# Patient Record
Sex: Male | Born: 1995 | Race: Black or African American | Hispanic: No | Marital: Single | Smoking: Never smoker
Health system: Southern US, Community
[De-identification: ages and names within clinical notes are randomized; demographics above are authoritative.]

---

## 2014-09-07 ENCOUNTER — Ambulatory Visit (INDEPENDENT_AMBULATORY_CARE_PROVIDER_SITE_OTHER): Payer: PRIVATE HEALTH INSURANCE | Admitting: Family Medicine

## 2014-09-07 VITALS — BP 108/66 | HR 68 | Temp 98.5°F | Resp 18 | Ht 77.0 in | Wt 213.0 lb

## 2014-09-07 DIAGNOSIS — K625 Hemorrhage of anus and rectum: Secondary | ICD-10-CM | POA: Diagnosis not present

## 2014-09-07 LAB — POC HEMOCCULT BLD/STL (OFFICE/1-CARD/DIAGNOSTIC): Fecal Occult Blood, POC: POSITIVE

## 2014-09-07 LAB — POCT CBC
Granulocyte percent: 54.2 %G (ref 37–80)
HCT, POC: 46.1 % (ref 43.5–53.7)
Hemoglobin: 14.6 g/dL (ref 14.1–18.1)
LYMPH, POC: 2.7 (ref 0.6–3.4)
MCH: 30.8 pg (ref 27–31.2)
MCHC: 31.8 g/dL (ref 31.8–35.4)
MCV: 97 fL (ref 80–97)
MID (CBC): 0.4 (ref 0–0.9)
MPV: 9 fL (ref 0–99.8)
PLATELET COUNT, POC: 156 10*3/uL (ref 142–424)
POC Granulocyte: 3.7 (ref 2–6.9)
POC LYMPH %: 40.2 % (ref 10–50)
POC MID %: 5.6 % (ref 0–12)
RBC: 4.75 M/uL (ref 4.69–6.13)
RDW, POC: 13.9 %
WBC: 6.8 10*3/uL (ref 4.6–10.2)

## 2014-09-07 NOTE — Progress Notes (Signed)
  Scott Orr - 18 y.o. male MRN [id number]  Date of birth: 10/30/1996  SUBJECTIVE:  Including CC & ROS.  patient C/O: rectal bleeding for 1 week Onset of symptoms: 1 week Symptoms: bright red and dark blood intermixed in stool, no straining, no rectal pain, no hemorrhoids, no diarrhea, no abdominal pain, no nausea, no vomiting, no malaise, no recent constipation. No family history of chron's, colitis, or colon cancer is know by the patient. He has been eating and drinking normally Relieving factors: nothing Worsened by: nothing he can think of    ROS:  Constitutional:  No fever, chills, or fatigue.  Respiratory:  No shortness of breath, cough, or wheezing Cardiovascular:  No palpitations, chest pain or syncope Gastrointestinal:  No nausea, no abdominal pain Review of systems otherwise negative except for what is stated in HPI  HISTORY: Past Medical, Surgical, Social, and Family History Reviewed & Updated per EMR. Pertinent Historical Findings include: Visual merchandiseronsmoker Student playing basketball at [employer]  PHYSICAL EXAM:  VS: BP:108/66 mmHg  HR:68bpm  TEMP:98.5 F (36.9 C)(Oral)  RESP:99 %  HT:6\' 5"  (195.6 cm)   WT:213 lb (96.616 kg)  BMI:25.3 PHYSICAL EXAM: General:  Alert and oriented, No acute distress.   HENT:  Normocephalic, Oral mucosa is moist.   Respiratory:  Lungs are clear to auscultation, Respirations are non-labored, Symmetrical chest wall expansion.   Cardiovascular:  Normal rate, Regular rhythm, No murmur, Good pulses equal in all extremities, No edema.   Gastrointestinal:  Soft, Non-tender, Non-distended, Normal bowel sounds, No organomegaly.   Rectal Exam:  Reveals no external hemorrhoids, no internal hemorrhoids on exam.  Hemoccult Stool sample obtained:  Integumentary:  Warm, Dry, No rash.   Neurologic:  Alert, Oriented, No focal defects Psychiatric:  Cooperative, Appropriate mood & affect.    ASSESSMENT & PLAN:  Rectal bleeding without acute blood  loss anemia DDx: internal hemorrhoids, chron's, colitis, Diverticulosis  Recommendations: - CBC with a stable H/H at  - Hemoccult sample positive for blood - Referral to GI for evaluation and possible colonoscopy

## 2014-09-08 NOTE — Addendum Note (Signed)
Addended by: Ethelda ChickSMITH, Jhordan Mckibben M on: 09/08/2014 09:30 AM   Modules accepted: Level of Service

## 2014-09-08 NOTE — Progress Notes (Signed)
History and physical reviewed in detail with Dr. Tammy Soursidiano. Agree with assessment and plan.

## 2014-09-29 ENCOUNTER — Encounter: Payer: Self-pay | Admitting: Sports Medicine

## 2014-09-29 ENCOUNTER — Ambulatory Visit (INDEPENDENT_AMBULATORY_CARE_PROVIDER_SITE_OTHER): Payer: Medicaid Other | Admitting: Sports Medicine

## 2014-09-29 VITALS — Ht 77.0 in | Wt 215.0 lb

## 2014-09-29 DIAGNOSIS — M25571 Pain in right ankle and joints of right foot: Secondary | ICD-10-CM

## 2014-09-29 MED ORDER — MELOXICAM 15 MG PO TABS: 15.0000 mg | ORAL_TABLET | Freq: Every day | ORAL | Status: AC

## 2014-09-29 NOTE — Progress Notes (Addendum)
Subjective:    Patient ID: Scott Orr, male    DOB: December 19, 1995, 18 y.o.   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 He suffered 3 inversion injuries to his ankle during the game. After the third injury he removed himself from the contest. He's complaining of pain at the base of the fifth metatarsal. He was seen at [facility] earlier this week and x-rays of his right foot were obtained. They are available for review. He was told that he might have a "small crack". He was placed into a Cam Walker which he finds quite comfortable. He has not noticed any swelling. He denies pain elsewhere in his foot. No pain in his ankle. He has a history of recurring ankle sprains and a "ankle fracture". He does have a med spec brace to wear with basketball but admits that he was not wearing it during the game. He denies any pain more proximally in his knee. Past medical history reviewed Surgical history reviewed No known drug allergies Social history reviewed    Review of Systems     Objective:   Physical Exam Well-developed, well-nourished. No acute distress. Awake alert and oriented 3. Vital signs reviewed  Right foot: There is tenderness to palpation at the styloid of the base of the fifth metatarsal. There is no soft tissue swelling. There is no tenderness to palpation more proximally along the peroneal tendon. Pain is reproducible with dorsiflexion and eversion of the foot. There is no tenderness to palpation along the fifth metatarsal shaft nor over the area of a Jones fracture. No pain with metatarsal squeeze today. Right ankle: No swelling. Full range of motion. Negative anterior drawer. 1+ talar tilt. No tenderness over the distal fibular medial malleolus. Neurovascularly intact distally. Walking with a slight limp.  MSK ultrasound of the right  foot was performed. Limited images of the right fifth metatarsal were obtained. There is no cortical irregularity to suggest fracture. There is no increased neovascularity in the cortex of the bone. There is a slight amount of edema at the insertion of the peroneal brevis tendon at the base of the fifth metatarsal but no obvious tear of the tendon is seen. Tendon is normal more proximally. Findings are consistent with peroneal brevis tendon strain at the insertion onto the fifth metatarsal.  X-rays from [facility] dated 09/27/2014 are reviewed. Images include AP, lateral, and oblique views. No obvious fracture is seen.       Assessment & Plan:  Right foot pain secondary to peroneal tendon strain  Patient will remain in his Cam Walker for an additional week. I'll place him on meloxicam 15 mg daily for the next 7 days. We will follow his progress in the training room at [employer]. Once he is out of his Cam Dan HumphreysWalker we will start rehabilitation in the training room and will increase activity as tolerated. If symptoms persist I may need to consider merits of further diagnostic imaging in the form of an MRI of his foot but his x-rays, clinical exam, and ultrasound findings today do not seem to suggest any sort of stress fracture or Jones fracture.    Addendum: 10/09/14  Patient seen by Dr. Tammy Soursidiano in the training room for f/u on his peroneal tendon strain. Patient was in a cam walking boot for 1 week. His swelling has resolved and he reports improvement  in pain. Patient did participate in a non-contact practice on Sunday and had pain with running. Patient has not done any rehab type strengthening or cutting or pivoting drills. No TTP at peroneal tendon attachments, normal ankle ROM, no ligament laxity. Based on this improvement but still having some pain we recommended continue practice with gradual return to contact over the next 2-3 days. If pain continues to with running and cutting  will recommend MRI. Patient will not pain Wednesday and will plan to play Saturday as long as he continues to clinical improve throughout the week.

## 2014-10-10 ENCOUNTER — Encounter: Payer: Self-pay | Admitting: Sports Medicine

## 2016-07-11 ENCOUNTER — Ambulatory Visit (HOSPITAL_COMMUNITY)
Admission: EM | Admit: 2016-07-11 | Discharge: 2016-07-11 | Disposition: A | Discharge status: Home / Self Care | Payer: Medicaid Other | Attending: Family Medicine | Admitting: Family Medicine

## 2016-07-11 ENCOUNTER — Encounter (HOSPITAL_COMMUNITY): Payer: Self-pay | Admitting: *Deleted

## 2016-07-11 DIAGNOSIS — Z711 Person with feared health complaint in whom no diagnosis is made: Secondary | ICD-10-CM

## 2016-07-11 DIAGNOSIS — Z202 Contact with and (suspected) exposure to infections with a predominantly sexual mode of transmission: Secondary | ICD-10-CM

## 2016-07-11 MED ORDER — CEFTRIAXONE SODIUM 250 MG IJ SOLR
250.0000 mg | Freq: Once | INTRAMUSCULAR | Status: AC
  Administered 2016-07-11: 250 mg via INTRAMUSCULAR

## 2016-07-11 MED ORDER — AZITHROMYCIN 250 MG PO TABS
ORAL_TABLET | ORAL | Status: AC
  Filled 2016-07-11: qty 4

## 2016-07-11 MED ORDER — CEFTRIAXONE SODIUM 250 MG IJ SOLR
INTRAMUSCULAR | Status: AC
  Filled 2016-07-11: qty 250

## 2016-07-11 MED ORDER — AZITHROMYCIN 250 MG PO TABS
1000.0000 mg | ORAL_TABLET | Freq: Once | ORAL | Status: AC
  Administered 2016-07-11: 1000 mg via ORAL

## 2016-07-11 NOTE — Discharge Instructions (Signed)
We will call with test results and treat as indicated °

## 2016-07-11 NOTE — ED Triage Notes (Signed)
Pt  denys    Any  Symptoms   However        Was  Told      By  His  Partner   That  They    Had  chylymydia     today

## 2016-07-11 NOTE — ED Provider Notes (Signed)
[facility]    CSN: [id number] Arrival date & time: 07/11/16  1849  First Provider Contact:  None       History   Chief Complaint Chief Complaint  Patient presents with  . Exposure to STD    HPI Scott Orr is a 20 y.o. male.    Male GU Problem  Presenting symptoms: no dysuria and no penile discharge   Presenting symptoms comment:  Report that a sexual contact called today and said she had chlamydia, pt with no sx, did not use condoms with this person. Context: after intercourse   Relieved by:  None tried Worsened by:  Nothing Ineffective treatments:  None tried Associated symptoms: no abdominal pain, no genital rash, no groin pain, no penile swelling, no urinary frequency, no urinary hesitation, no urinary incontinence and no urinary retention     History reviewed. No pertinent past medical history.  There are no active problems to display for this patient.   History reviewed. No pertinent surgical history.     Home Medications    Prior to Admission medications   Medication Sig Start Date End Date Taking? Authorizing Provider  meloxicam (MOBIC) 15 MG tablet Take 1 tablet (15 mg total) by mouth daily. 09/29/14   Ralene Corkimothy R Draper, DO    Family History History reviewed. No pertinent family history.  Social History Social History  Substance Use Topics  . Smoking status: Never Smoker  . Smokeless tobacco: Not on file  . Alcohol use Yes     Allergies   Review of patient's allergies indicates no known allergies.   Review of Systems Review of Systems  Gastrointestinal: Negative for abdominal pain.  Genitourinary: Negative for bladder incontinence, discharge, dysuria, frequency, hesitancy, penile swelling and urgency.  All other systems reviewed and are negative.    Physical Exam Triage Vital Signs ED Triage Vitals [07/11/16 1912]  Enc Vitals Group     BP 128/72     Pulse Rate 60     Resp 16     Temp 98.6 F (37 C)     Temp  Source Oral     SpO2 99 %     Weight      Height      Head Circumference      Peak Flow      Pain Score      Pain Loc      Pain Edu?      Excl. in GC?    No data found.   Updated Vital Signs BP 128/72 (BP Location: Left Arm)   Pulse 60   Temp 98.6 F (37 C) (Oral)   Resp 16   SpO2 99%   Visual Acuity Right Eye Distance:   Left Eye Distance:   Bilateral Distance:    Right Eye Near:   Left Eye Near:    Bilateral Near:     Physical Exam  Constitutional: He is oriented to person, place, and time. He appears well-developed and well-nourished.  Abdominal: Soft. Bowel sounds are normal.  Genitourinary: Penis normal.  Neurological: He is alert and oriented to person, place, and time. He has normal reflexes.  Skin: Skin is warm and dry.  Nursing note and vitals reviewed.    UC Treatments / Results  Labs (all labs ordered are listed, but only abnormal results are displayed) Labs Reviewed - No data to display  EKG  EKG Interpretation None       Radiology No results found.  Procedures Procedures (including  critical care time)  Medications Ordered in UC Medications - No data to display   Initial Impression / Assessment and Plan / UC Course  I have reviewed the triage vital signs and the nursing notes.  Pertinent labs & imaging results that were available during my care of the patient were reviewed by me and considered in my medical decision making (see chart for details).  Clinical Course      Final Clinical Impressions(s) / UC Diagnoses   Final diagnoses:  None    New Prescriptions New Prescriptions   No medications on file     Linna Hoff, MD 07/11/16 1949

## 2016-07-15 LAB — CYTOLOGY, (ORAL, ANAL, URETHRAL) ANCILLARY ONLY
Chlamydia: NEGATIVE
Neisseria Gonorrhea: NEGATIVE
Trichomonas: NEGATIVE

## 2016-09-17 ENCOUNTER — Ambulatory Visit (INDEPENDENT_AMBULATORY_CARE_PROVIDER_SITE_OTHER): Payer: Medicaid Other | Admitting: Student

## 2016-09-17 ENCOUNTER — Encounter: Payer: Self-pay | Admitting: Student

## 2016-09-17 DIAGNOSIS — M216X1 Other acquired deformities of right foot: Secondary | ICD-10-CM

## 2016-09-17 DIAGNOSIS — M216X9 Other acquired deformities of unspecified foot: Secondary | ICD-10-CM | POA: Insufficient documentation

## 2016-09-17 DIAGNOSIS — M216X2 Other acquired deformities of left foot: Secondary | ICD-10-CM | POA: Diagnosis not present

## 2016-09-17 DIAGNOSIS — M25579 Pain in unspecified ankle and joints of unspecified foot: Secondary | ICD-10-CM | POA: Diagnosis not present

## 2016-09-17 NOTE — Assessment & Plan Note (Addendum)
Overpronation of his bilateral feet were found with running. He was given arch straps to help with arch pain. He is also given green insoles to wear shoes. He will follow-up in 1 week for custom orthotics. He is instructed to bring his basketball shoes.

## 2016-09-17 NOTE — Progress Notes (Signed)
  Guadelupe Sabinlston Rovner - 20 y.o. male MRN [id number]  Date of birth: 1996-01-06  SUBJECTIVE:  Including CC & ROS.  CC: Left foot pain Presents for left greater than right foot pain. 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 He is having foot pain when he runs in his midfoot. He had been given green insoles previously which have helped with his pain. He lost his insoles and would like new ones.  He has pain when he runs. Denies any swelling to the area and no pain at rest. Denies any numbness or tingling distally.   ROS: No unexpected weight loss, fever, chills, swelling, instability, muscle pain, numbness/tingling, redness, otherwise see HPI   PMHx - Updated and reviewed.  Contributory factors include: Negative PSHx - Updated and reviewed.  Contributory factors include:  Negative FHx - Updated and reviewed.  Contributory factors include:  Negative Social Hx - Updated and reviewed. Contributory factors include: Negative, [employer] player. Medications - reviewed   DATA REVIEWED: Previous office visits  PHYSICAL EXAM:  VS: BP:(!) 123/45  HR: bpm  TEMP: ( )  RESP:   HT:6\' 5"  (195.6 cm)   WT:215 lb (97.5 kg)  BMI:25.5 PHYSICAL EXAM: Gen: NAD, alert, cooperative with exam, well-appearing HEENT: clear conjunctiva,  CV:  no edema, capillary refill brisk, normal rate Resp: non-labored Skin: no rashes, normal turgor  Neuro: no gross deficits.  Psych:  alert and oriented  Ankle & Foot: No visible swelling, ecchymosis, erythema, ulcers, calluses, blister Arch: Normal w/o pes cavus or planus  Achilles tendon without nodules or tenderness No swelling of retrocalcaneal bursa No pain at MT heads No pain at base of 5th MT; No tenderness over cuboid; No tenderness over N spot or navicular prominence No tenderness on lateral and medial malleolus No sign of peroneal tendon subluxations or tenderness to palpation Full in plantarflexion, inversion, and eversion of the foot; flexion and  extension of the toes, slight decrease of range of motion in dorsiflexion to 90  Strength: 5/5 in all directions. Sensation: intact Vascular: intact w/ dorsalis pedis & posterior tibialis pulses 2+  Gait  Overall balanced gait with appropriate base width and stride length  Foot: pronation when walking; No in-toeing or out-toeing; No foot slap or high steppage gait  Knee: extension and flexion adequate w/o genu varus or valgus  Hip: No circumduction or contralateral drop  Trunk: Neutral w/o lean      ASSESSMENT & PLAN:   Pronation of foot Overpronation of his bilateral feet were found with running. He was given arch straps to help with arch pain. He is also given green insoles to wear shoes. He will follow-up in 1 week for custom orthotics. He is instructed to bring his basketball shoes.

## 2016-09-24 ENCOUNTER — Encounter: Payer: Medicaid Other | Admitting: Student

## 2016-09-26 ENCOUNTER — Ambulatory Visit (INDEPENDENT_AMBULATORY_CARE_PROVIDER_SITE_OTHER): Payer: Medicaid Other | Admitting: Student

## 2016-09-26 ENCOUNTER — Encounter: Payer: Self-pay | Admitting: Student

## 2016-09-26 VITALS — BP 114/56 | HR 65 | Ht 77.0 in | Wt 215.0 lb

## 2016-09-26 DIAGNOSIS — M216X2 Other acquired deformities of left foot: Secondary | ICD-10-CM

## 2016-09-26 DIAGNOSIS — M216X1 Other acquired deformities of right foot: Secondary | ICD-10-CM | POA: Diagnosis present

## 2016-09-26 NOTE — Progress Notes (Signed)
  Scott Orr - 20 y.o. male MRN [id number]  Date of birth: 09-06-1996  SUBJECTIVE:  Including CC & ROS.  CC: right foot pain  Presents for follow up of right midfoot pain and overpronation of bilateral feet.  He presents for orthotics.  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  He has done well with green insoles, but feels he needs more support.   ROS: No unexpected weight loss, fever, chills, swelling, instability, muscle pain, numbness/tingling, redness, otherwise see HPI   PMHx - Updated and reviewed.  Contributory factors include: Negative PSHx - Updated and reviewed.  Contributory factors include:  Negative FHx - Updated and reviewed.  Contributory factors include:  Negative Social Hx - Updated and reviewed. Contributory factors include: Negative Medications - reviewed   DATA REVIEWED: none  PHYSICAL EXAM:  VS: BP:(!) 114/56  HR:65bpm  TEMP: ( )  RESP:   HT:6\' 5"  (195.6 cm)   WT:215 lb (97.5 kg)  BMI:25.5 PHYSICAL EXAM: Gen: NAD, alert, cooperative with exam, well-appearing HEENT: clear conjunctiva,  CV:  no edema, capillary refill brisk, normal rate Resp: non-labored Skin: no rashes, normal turgor  Neuro: no gross deficits.  Psych:  alert and oriented  Ankle & Foot: No visible swelling, ecchymosis, erythema, ulcers, calluses, blister Arch: Normal w/o pes cavus or planus  No pain at base of 5th MT; No tenderness over cuboid; No tenderness over N spot or navicular prominence No sign of peroneal tendon subluxations or tenderness to palpation Full in plantarflexion, dorsiflexion, inversion, and eversion of the foot; flexion and extension of the toes Strength: 5/5 in all directions. Sensation: intact Vascular: intact w/ dorsalis pedis & posterior tibialis pulses 2+  ASSESSMENT & PLAN:   Pronation of foot Custom orthotics provided.  He will start wearing in practice next week and follow up in training room.  Patient was fitted  for a : standard, cushioned, semi-rigid orthotic. The orthotic was heated and afterward the patient stood on the orthotic blank positioned on the orthotic stand. The patient was positioned in subtalar neutral position and 10 degrees of ankle dorsiflexion in a weight bearing stance. After completion of molding, a stable base was applied to the orthotic blank. The blank was ground to a stable position for weight bearing. Size: 16 Base: blue  Patient was counseled reviewing diagnosis and treatment in detail, totaling in 50 minutes, over half of which was spent in face to face counseling.

## 2016-09-26 NOTE — Assessment & Plan Note (Signed)
Custom orthotics provided.  He will start wearing in practice next week and follow up in training room.

## 2016-12-09 ENCOUNTER — Ambulatory Visit (INDEPENDENT_AMBULATORY_CARE_PROVIDER_SITE_OTHER): Payer: PRIVATE HEALTH INSURANCE | Admitting: Sports Medicine

## 2016-12-09 VITALS — BP 104/64 | Ht 77.0 in | Wt 210.0 lb

## 2016-12-09 DIAGNOSIS — J069 Acute upper respiratory infection, unspecified: Secondary | ICD-10-CM | POA: Diagnosis not present

## 2016-12-09 DIAGNOSIS — B9789 Other viral agents as the cause of diseases classified elsewhere: Secondary | ICD-10-CM

## 2016-12-09 NOTE — Progress Notes (Signed)
    Subjective:  Scott Orr is a 21 y.o. male who [facility] with a chief complaint of vomiting.   HPI:  Vomiting Patient's symptoms started about 3 days ago with cough, sneeze, sore throat and subjective fevers. He had a couple episodes of emesis after a coughing fit, though he did have another episode of emesis unrelated to coughing. Symptoms are much better today. He only has a mild cough. No fevers, nausea, or myalgias. He took dayquil, mucinex, and nyquil.   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    ROS: Per HPI   Objective:  Physical Exam: BP 104/64   Ht 6\' 5"  (1.956 m)   Wt 210 lb (95.3 kg)   BMI 24.90 kg/m   Gen: NAD, resting comfortably HEENT: TMs and OP clear.  CV: RRR with no murmurs appreciated Pulm: NWOB, Occasional expiratory wheeze.  MSK: no edema, cyanosis, or clubbing noted Skin: warm, dry Neuro: grossly normal, moves all extremities Psych: Normal affect and thought content  Assessment/Plan:  Viral URI Doubt flu given lack of fever. No signs of bacterial infection. Seems to be recovering normally. Given that he is afebrile, he is clear to go back to participate in basketball. Recommended use of albuterol prior to participation the next couple of days given his wheezing on exam. Return precautions reviewed. Follow up as needed.   Katina Degreealeb M. Jimmey RalphParker, MD [facility] Resident PGY-3 12/09/2016 11:21 AM   Patient seen and evaluated with the resident. I agree with the above plan of care. I think Scott Orr has a simple viral URI. He is feeling better. He has an albuterol inhaler to use as needed. He is cleared to return to basketball. Follow-up with me as needed.

## 2017-08-13 ENCOUNTER — Ambulatory Visit (INDEPENDENT_AMBULATORY_CARE_PROVIDER_SITE_OTHER): Payer: Self-pay | Admitting: Sports Medicine

## 2017-08-13 DIAGNOSIS — M722 Plantar fascial fibromatosis: Secondary | ICD-10-CM

## 2017-08-13 NOTE — Assessment & Plan Note (Signed)
Recommended that he restart wearing his insoles, was also given bilateral arch straps. Additionally was given exercises for heel raises unilaterally, toe stretches, toe walking, reverse walking, heel walking. Exercise and drill to pain tolerance over the next couple of weeks, slowly reintroduce formal basketball as he tolerates.

## 2017-08-13 NOTE — Progress Notes (Signed)
   Subjective:    Patient ID: Scott Orr, male    DOB: 1996/09/17, 21 y.o.   MRN: [id number]  HPI  Scott Orr presents today with ongoing bilateral plantar fasciitis pain left greater than right. He has previously been fitted with custom insoles and has also worn arch straps in the past. Has not been wearing either of these for several months because he is feeling better, but noticed an exacerbation of his foot pain a couple weeks ago while doing basketball sprints. Since that time, he has been using his orthotics most of the day, has been doing toe stretches, and has also been rolling a frozen water bottle under his foot. Notes that his foot pain is bearable after walking around for some time during the day.   Review of Systems  No swelling in feet No numbness     Objective:   Physical Exam BP 108/68   Ht  (1.956 m)   Wt 195 lb (88.5 kg)   BMI 23.12 kg/m   Bilateral feet: Minimal tenderness to palpation of  posterior insertion of plantar fascia. Mild pes planus bilat  No defect in ankle strength or range of motion bilaterally.  Normal gait.      Assessment & Plan:  Plantar fasciitis, bilateral Recommended that he restart wearing his insoles, was also given bilateral arch straps. Additionally was given exercises for heel raises unilaterally, toe stretches, toe walking, reverse walking, heel walking. Exercise and drill to pain tolerance over the next couple of weeks, slowly reintroduce formal basketball as he tolerates.   Loni Muse, MD   I observed and examined the patient with the resident and agree with assessment and plan.  Note reviewed and modified by me. Enid Baas, MD

## 2020-09-20 DIAGNOSIS — R0789 Other chest pain: Secondary | ICD-10-CM | POA: Insufficient documentation

## 2020-09-21 ENCOUNTER — Other Ambulatory Visit: Payer: Self-pay

## 2020-09-21 ENCOUNTER — Emergency Department (HOSPITAL_COMMUNITY): Payer: Self-pay

## 2020-09-21 ENCOUNTER — Encounter (HOSPITAL_COMMUNITY): Payer: Self-pay

## 2020-09-21 ENCOUNTER — Emergency Department (HOSPITAL_COMMUNITY)
Admission: EM | Admit: 2020-09-21 | Discharge: 2020-09-21 | Disposition: A | Discharge status: Home / Self Care | Payer: Self-pay | Attending: Emergency Medicine | Admitting: Emergency Medicine

## 2020-09-21 DIAGNOSIS — R0789 Other chest pain: Secondary | ICD-10-CM

## 2020-09-21 LAB — CBC
HCT: 44.8 % (ref 39.0–52.0)
Hemoglobin: 15.1 g/dL (ref 13.0–17.0)
MCH: 31.9 pg (ref 26.0–34.0)
MCHC: 33.7 g/dL (ref 30.0–36.0)
MCV: 94.5 fL (ref 80.0–100.0)
Platelets: 209 10*3/uL (ref 150–400)
RBC: 4.74 MIL/uL (ref 4.22–5.81)
RDW: 11.5 % (ref 11.5–15.5)
WBC: 10.3 10*3/uL (ref 4.0–10.5)
nRBC: 0 % (ref 0.0–0.2)

## 2020-09-21 LAB — TROPONIN I (HIGH SENSITIVITY)
Troponin I (High Sensitivity): <2 ng/L (ref <18)
Troponin I (High Sensitivity): <2 ng/L (ref <18)

## 2020-09-21 LAB — BASIC METABOLIC PANEL
Anion gap: 13 (ref 5–15)
BUN: 18 mg/dL (ref 6–20)
CO2: 24 mmol/L (ref 22–32)
Calcium: 9.4 mg/dL (ref 8.9–10.3)
Chloride: 102 mmol/L (ref 98–111)
Creatinine, Ser: 1.14 mg/dL (ref 0.61–1.24)
GFR, Estimated: >60 mL/min (ref >60)
Glucose, Bld: 136 mg/dL — ABNORMAL HIGH (ref 70–99)
Potassium: 3.6 mmol/L (ref 3.5–5.1)
Sodium: 139 mmol/L (ref 135–145)

## 2020-09-21 NOTE — Discharge Instructions (Addendum)
The ED work-up today was reassuring.  Your chest x-ray and laboratory tests were all normal.  Monitor for any recurrent symptoms including fevers and chills.  Follow-up with primary care doctor to schedule a routine checkup

## 2020-09-21 NOTE — ED Notes (Signed)
Pt refused to put on grown, also refused to be put on the monitor

## 2020-09-21 NOTE — ED Provider Notes (Signed)
[facility]-EMERGENCY DEPT Provider Note   CSN: [id number] Arrival date & time: 09/20/20  2358     History Chief Complaint  Patient presents with  . Chest Pain    Scott Orr is a 24 y.o. male.  HPI   Patient presented to the ED last night for an episode of chest discomfort associated with diffuse body discomfort as well as a sensation of not being able to control his body movements.  Patient states he did smoke marijuana prior to the onset of the symptoms but really did not think that was related to it.  He started having discomfort in his chest.  He states it was not really painful.  He started to feel like he was trembling and shaking and could not control his body.  Patient felt like he was having seizures but is never had seizures and was not losing consciousness.  Patient had to wait in the ED waiting room for evaluation before being seen and during that period of time all his symptoms have now resolved.  Patient denies having any trouble with fevers or chills.  No leg swelling.  Did not have any headache.  Patient has been vaccinated for Covid.  History reviewed. No pertinent past medical history.  Patient Active Problem List   Diagnosis Date Noted  . Plantar fasciitis, bilateral 08/13/2017  . Pronation of foot 09/17/2016    History reviewed. No pertinent surgical history.     No family history on file.  Social History   Tobacco Use  . Smoking status: Never Smoker  . Smokeless tobacco: Never Used  Substance Use Topics  . Alcohol use: Yes  . Drug use: No    Home Medications Prior to Admission medications   Medication Sig Start Date End Date Taking? Authorizing Provider  meloxicam (MOBIC) 15 MG tablet Take 1 tablet (15 mg total) by mouth daily. Patient not taking: Reported on 08/13/2017 09/29/14   Ralene Cork, DO    Allergies    Patient has no known allergies.  Review of Systems   Review of Systems  All other systems reviewed  and are negative.   Physical Exam Updated Vital Signs BP (!) 119/57 (BP Location: Left Arm)   Pulse 62   Temp 98.4 F (36.9 C) (Oral)   Resp 17   Ht 1.956 m (6\' 5" )   Wt 90.7 kg   SpO2 100%   BMI 23.72 kg/m   Physical Exam Vitals and nursing note reviewed.  Constitutional:      General: He is not in acute distress.    Appearance: He is well-developed.  HENT:     Head: Normocephalic and atraumatic.     Right Ear: External ear normal.     Left Ear: External ear normal.  Eyes:     General: No scleral icterus.       Right eye: No discharge.        Left eye: No discharge.     Conjunctiva/sclera: Conjunctivae normal.  Neck:     Trachea: No tracheal deviation.  Cardiovascular:     Rate and Rhythm: Normal rate and regular rhythm.  Pulmonary:     Effort: Pulmonary effort is normal. No respiratory distress.     Breath sounds: Normal breath sounds. No stridor. No wheezing or rales.  Abdominal:     General: Bowel sounds are normal. There is no distension.     Palpations: Abdomen is soft.     Tenderness: There is no abdominal tenderness.  There is no guarding or rebound.  Musculoskeletal:        General: No tenderness.     Cervical back: Neck supple.  Skin:    General: Skin is warm and dry.     Findings: No rash.  Neurological:     Mental Status: He is alert.     Cranial Nerves: No cranial nerve deficit (no facial droop, extraocular movements intact, no slurred speech).     Sensory: No sensory deficit.     Motor: No abnormal muscle tone or seizure activity.     Coordination: Coordination normal.     ED Results / Procedures / Treatments   Labs (all labs ordered are listed, but only abnormal results are displayed) Labs Reviewed  BASIC METABOLIC PANEL - Abnormal; Notable for the following components:      Result Value   Glucose, Bld 136 (*)    All other components within normal limits  CBC  TROPONIN I (HIGH SENSITIVITY)  TROPONIN I (HIGH SENSITIVITY)    EKG EKG  Interpretation  Date/Time:  Friday September 21 2020 00:16:43 EST Ventricular Rate:  118 PR Interval:    QRS Duration: 119 QT Interval:  332 QTC Calculation: 466 R Axis:   110 Text Interpretation: Sinus tachycardia Right atrial enlargement RBBB and LPFB Artifact in lead(s) I II aVR No old tracing to compare Confirmed by Linwood Dibbles [phone]) on 09/21/2020 7:01:45 AM   Radiology DG Chest 2 View  Result Date: 09/21/2020 CLINICAL DATA:  Chest pain, dyspnea EXAM: CHEST - 2 VIEW COMPARISON:  None. FINDINGS: The heart size and mediastinal contours are within normal limits. Both lungs are clear. The visualized skeletal structures are unremarkable. IMPRESSION: No active cardiopulmonary disease. Electronically Signed   By: Helyn Numbers MD   On: 09/21/2020 00:37    Procedures Procedures (including critical care time)  Medications Ordered in ED Medications - No data to display  ED Course  I have reviewed the triage vital signs and the nursing notes.  Pertinent labs & imaging results that were available during my care of the patient were reviewed by me and considered in my medical decision making (see chart for details).  Clinical Course as of Sep 21 828  [facility] Sep 21, 2020  0818 Labs reviewed.  CBC and metabolic panel normal.  Serial troponins normal.  Chest x-ray negative.   [JK]    Clinical Course User Index [JK] Linwood Dibbles, MD   MDM Rules/Calculators/A&P                          Patient presented to the ED for evaluation of chest discomfort as well as a sensation of difficulty controlling the movements of his limbs.  Patient waited in the ED for several hours before evaluation.  All his symptoms eventually resolved.  ED work-up is reassuring.  I doubt acute coronary syndrome, pulmonary embolism, pneumonia or other acute cardiopulmonary issues.  Unclear what these abnormal movements were.  It does not sound like he was having seizures.  He does not have any electrolyte abnormalities.   Its possible he may have had some type of reaction to either the marijuana or substances that may have been in the marijuana.  At this time there does not appear to be any evidence of an acute emergency medical condition and the patient appears stable for discharge with appropriate outpatient follow up.  Final Clinical Impression(s) / ED Diagnoses Final diagnoses:  Chest discomfort  Rx / DC Orders ED Discharge Orders    None       Linwood Dibbles, MD 09/21/20 0830

## 2020-09-21 NOTE — ED Triage Notes (Addendum)
Pt reports left sided chest pain beginning at 2330 last night. Pt reports the cp is causing involuntary leg movements.  Pt admits to smoking marijuana prior to arrival.

## 2022-07-06 IMAGING — CR DG CHEST 2V
2 series · 2 of 2 positions shown · non-contrast
Comparison: None.

CLINICAL DATA: Chest pain, dyspnea

EXAM:
CHEST - 2 VIEW

[w chest lat]
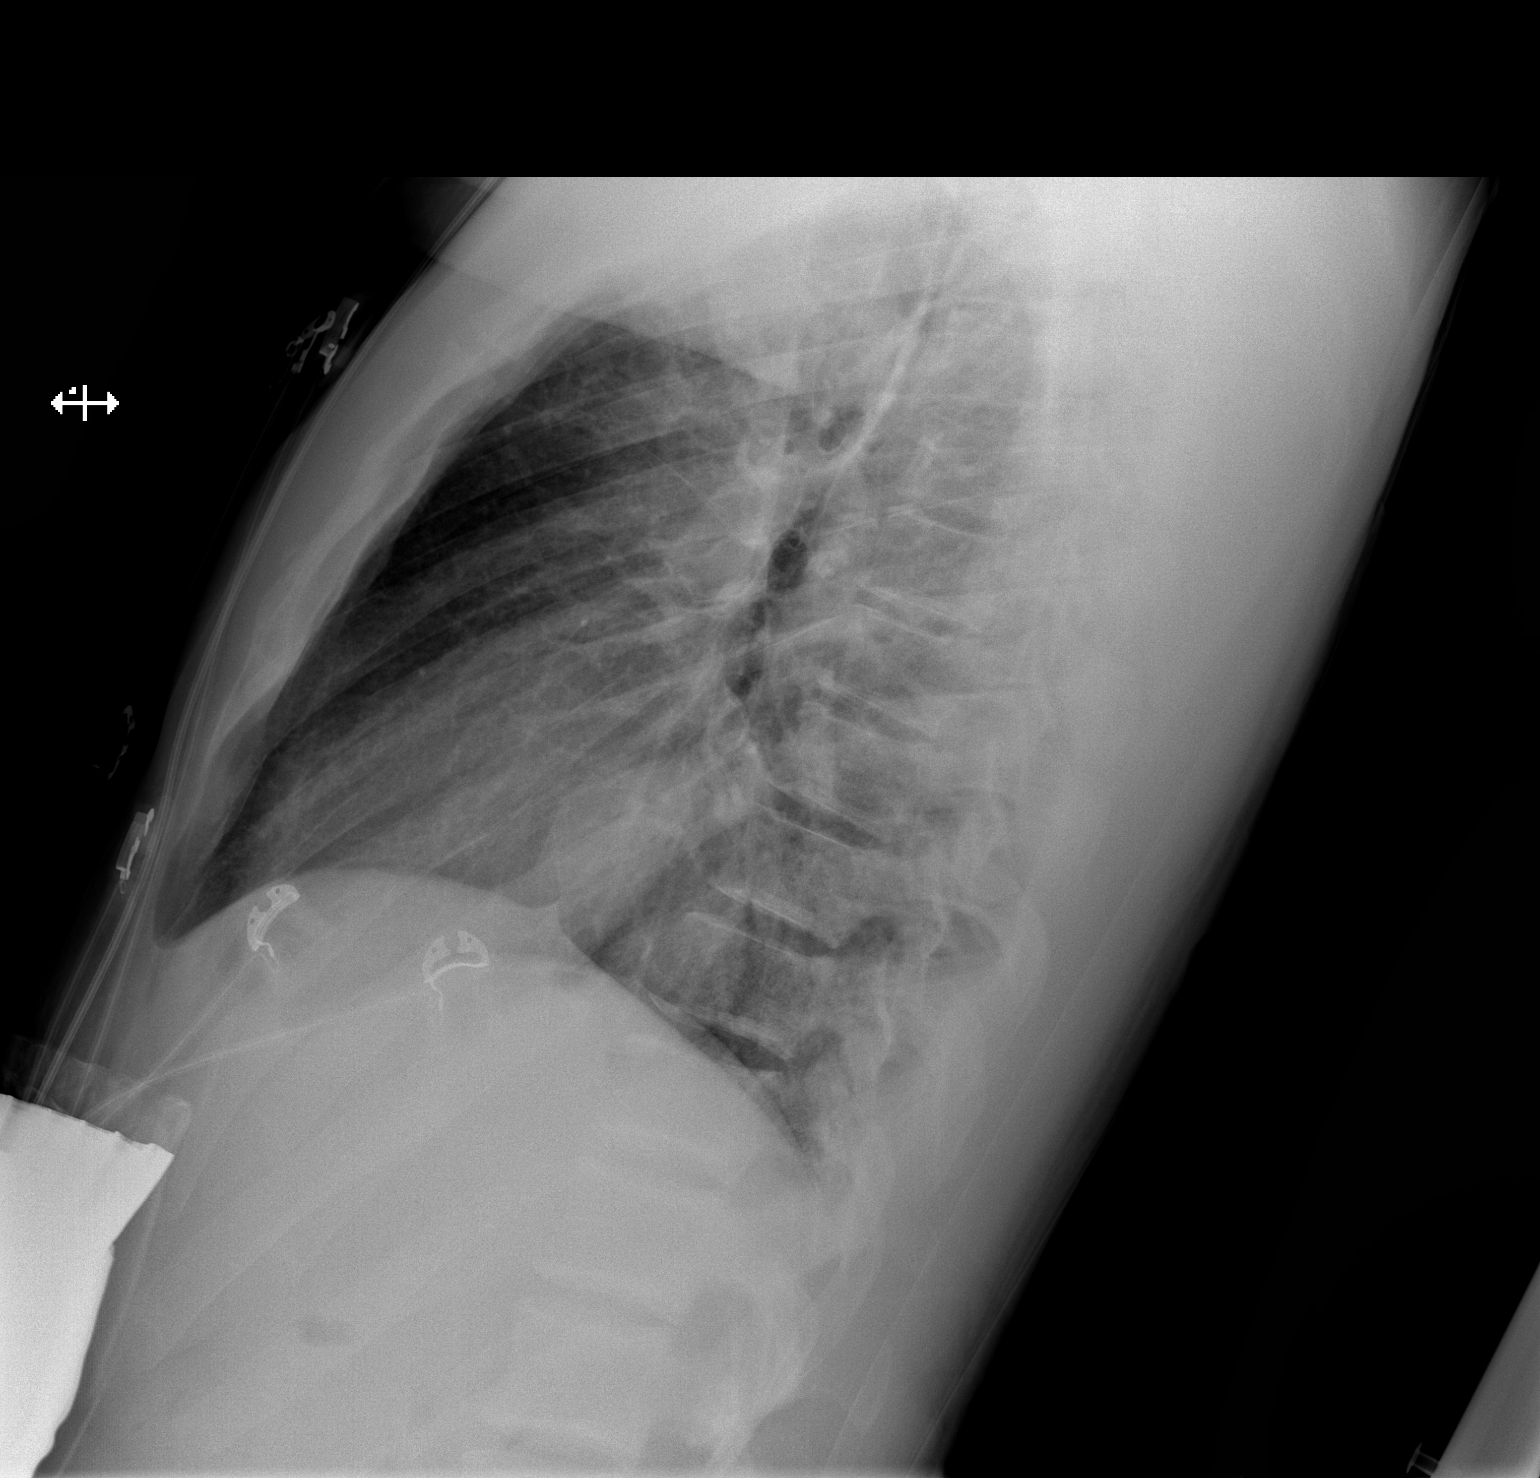

[x chest ap]
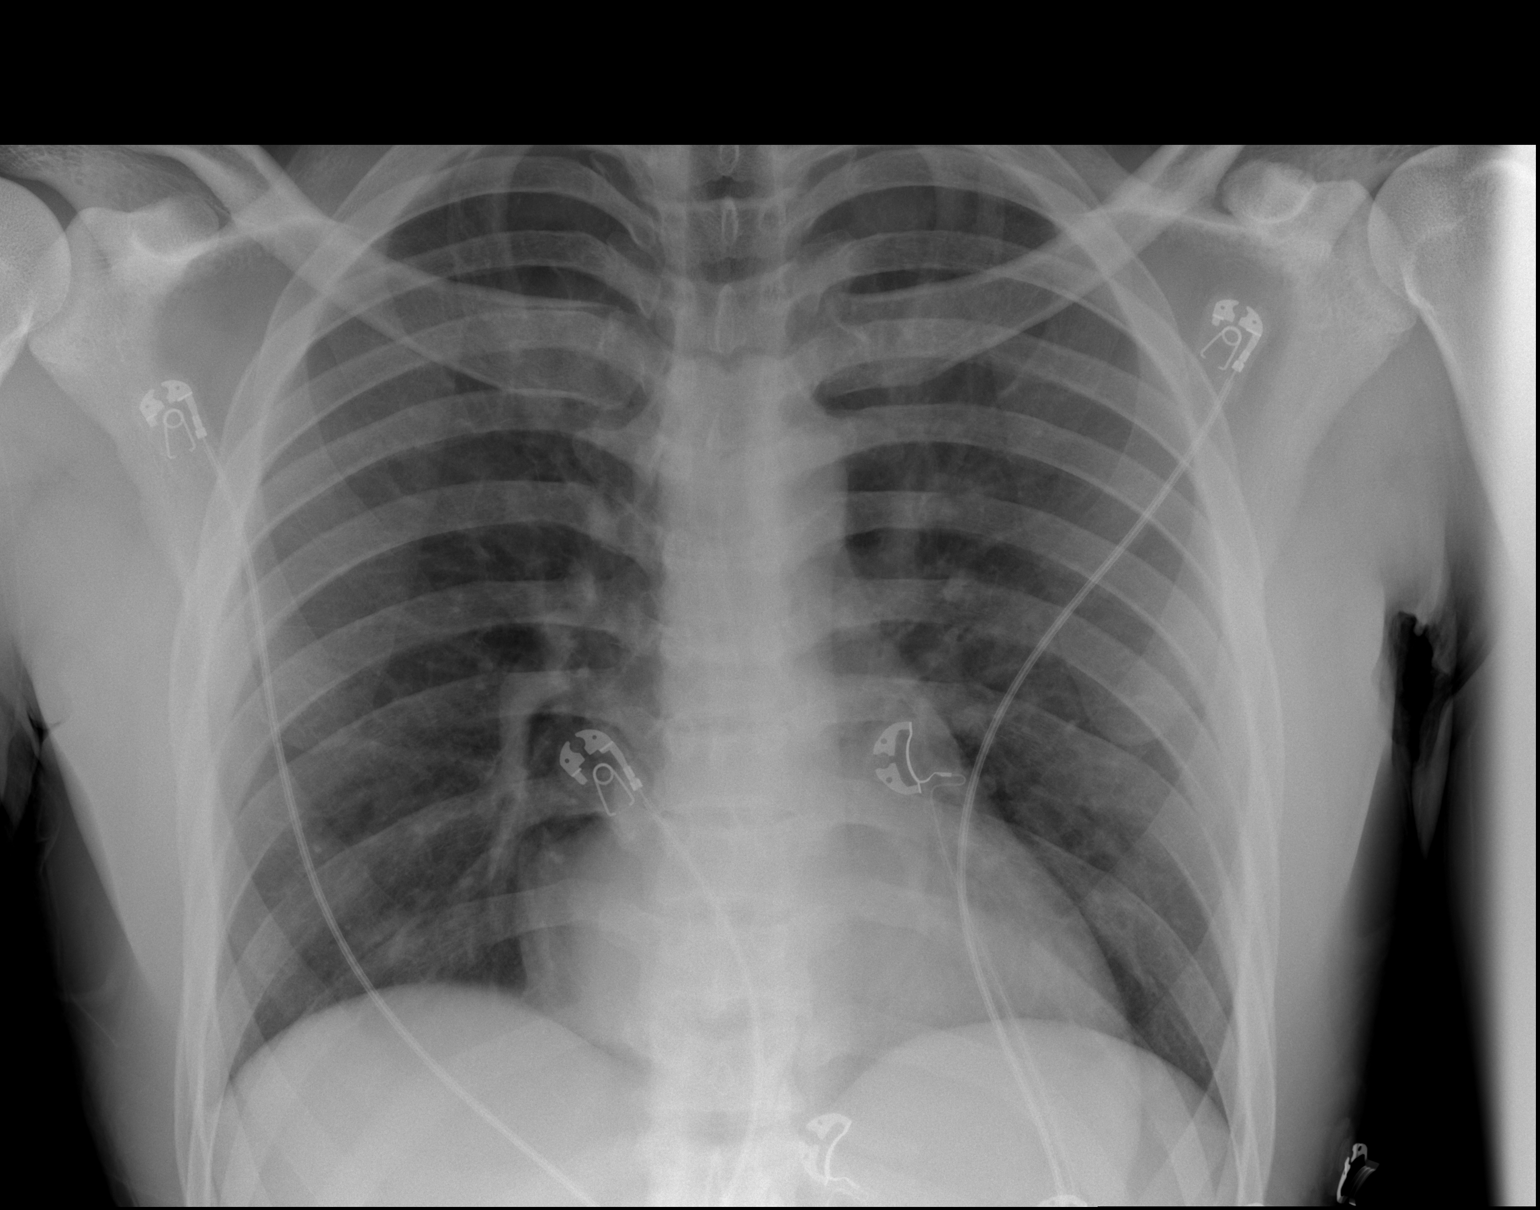

[2 of 2 positions shown; findings below may reference images not displayed]

FINDINGS: The heart size and mediastinal contours are within normal limits.
Both lungs are clear. The visualized skeletal structures are
unremarkable.
IMPRESSION: No active cardiopulmonary disease.
